# Patient Record
Sex: Female | Born: 1990 | Race: Black or African American | Hispanic: No | Marital: Single | Smoking: Current every day smoker
Health system: Southern US, Community
[De-identification: ages and names within clinical notes are randomized; demographics above are authoritative.]

---

## 2013-11-20 ENCOUNTER — Emergency Department (HOSPITAL_COMMUNITY)
Admission: EM | Admit: 2013-11-20 | Discharge: 2013-11-20 | Disposition: A | Discharge status: Home / Self Care | Payer: Medicaid Other | Attending: Emergency Medicine | Admitting: Emergency Medicine

## 2013-11-20 ENCOUNTER — Encounter (HOSPITAL_COMMUNITY): Payer: Self-pay | Admitting: Emergency Medicine

## 2013-11-20 ENCOUNTER — Emergency Department (HOSPITAL_COMMUNITY): Payer: Medicaid Other

## 2013-11-20 DIAGNOSIS — H6122 Impacted cerumen, left ear: Secondary | ICD-10-CM

## 2013-11-20 DIAGNOSIS — F1210 Cannabis abuse, uncomplicated: Secondary | ICD-10-CM | POA: Insufficient documentation

## 2013-11-20 DIAGNOSIS — J069 Acute upper respiratory infection, unspecified: Secondary | ICD-10-CM | POA: Insufficient documentation

## 2013-11-20 DIAGNOSIS — Z79899 Other long term (current) drug therapy: Secondary | ICD-10-CM | POA: Insufficient documentation

## 2013-11-20 DIAGNOSIS — H6120 Impacted cerumen, unspecified ear: Secondary | ICD-10-CM | POA: Insufficient documentation

## 2013-11-20 DIAGNOSIS — F17200 Nicotine dependence, unspecified, uncomplicated: Secondary | ICD-10-CM | POA: Insufficient documentation

## 2013-11-20 MED ORDER — GUAIFENESIN 100 MG/5ML PO LIQD: 100.0000 mg | ORAL | Status: DC | PRN

## 2013-11-20 MED ORDER — ALBUTEROL SULFATE HFA 108 (90 BASE) MCG/ACT IN AERS: 2.0000 | INHALATION_SPRAY | Freq: Four times a day (QID) | RESPIRATORY_TRACT | Status: DC | PRN

## 2013-11-20 NOTE — ED Notes (Signed)
Pt taken to xray 

## 2013-11-20 NOTE — ED Notes (Signed)
Pt states that after thanksgiving pt started experiencing cough, wheezing and not being able to hear out of her left ear. Pt states that she has tried nyquil. Pt states she has not seen a PCP.

## 2013-11-20 NOTE — ED Provider Notes (Addendum)
CSN: [id number]     Arrival date & time 11/20/13  1951 History  This chart was scribed for non-physician practitioner, Francee Piccolo, PA-C working with Junius Argyle, MD, by Andrew Au, ED Scribe. This patient was seen in room TR11C/TR11C and the patient's care was started at 8:43 PM     Chief Complaint  Patient presents with  . Wheezing    The history is provided by the patient. No language interpreter was used.   HPI Comments: Veronica Lee is a 22 y.o. female who presents to the Emergency Department complaining of unchanged, non-productive cough and wheezing that began 10 days ago. She states nothing makes the cough or wheezing better. She is also complaining of a left ear pain with associated decreased hearing. She denies any ear drainage, fever. Pt does smoke marijuana ofthen. Pt denies a history of asthma.   History reviewed. No pertinent past medical history. History reviewed. No pertinent past surgical history. No family history on file. History  Substance Use Topics  . Smoking status: Current Every Day Smoker  . Smokeless tobacco: Not on file  . Alcohol Use: Yes     Comment: 3 drinks per month.   OB History   Grav Para Term Preterm Abortions TAB SAB Ect Mult Living                 Review of Systems  Constitutional: Negative for fever.  HENT: Positive for ear pain. Negative for ear discharge.   Respiratory: Positive for cough and wheezing. Negative for chest tightness and shortness of breath.     Allergies  Review of patient's allergies indicates no known allergies.  Home Medications   Current Outpatient Rx  Name  Route  Sig  Dispense  Refill  . albuterol (PROVENTIL HFA;VENTOLIN HFA) 108 (90 BASE) MCG/ACT inhaler   Inhalation   Inhale 2 puffs into the lungs every 6 (six) hours as needed for wheezing or shortness of breath.   1 Inhaler   2   . guaiFENesin (ROBITUSSIN) 100 MG/5ML liquid   Oral   Take 5-10 mLs (100-200 mg total) by mouth every 4 (four)  hours as needed for cough.   60 mL   0    BP 137/99  Pulse 84  Temp(Src) 97.7 F (36.5 C) (Oral)  Resp 16  Ht 5\' 2"  (1.575 m)  Wt 187 lb (84.823 kg)  BMI 34.19 kg/m2  SpO2 97%  LMP 09/20/2013 Physical Exam  Nursing note and vitals reviewed. Constitutional: She is oriented to person, place, and time. She appears well-developed and well-nourished. No distress.  HENT:  Head: Normocephalic and atraumatic.  Right Ear: Hearing, tympanic membrane, external ear and ear canal normal.  Left Ear: External ear and ear canal normal.  Nose: Rhinorrhea present.  Mouth/Throat: Oropharynx is clear and moist.  Left ear cerumen impaction.   Eyes: Conjunctivae are normal.  Neck: Normal range of motion. Neck supple.  Cardiovascular: Normal rate.   Pulmonary/Chest: Effort normal and breath sounds normal. No respiratory distress.  Abdominal: Soft.  Musculoskeletal: Normal range of motion.  Neurological: She is alert and oriented to person, place, and time.  Skin: Skin is warm and dry. She is not diaphoretic.  Psychiatric: She has a normal mood and affect.    ED Course  EAR CERUMEN REMOVAL Date/Time: 11/30/2013 6:30 PM Performed by: Jeannetta Ellis Authorized by: Jeannetta Ellis Consent: Verbal consent obtained. Local anesthetic: none Location details: left ear Procedure type: curette and irrigation Patient sedated: no  DIAGNOSTIC STUDIES: Oxygen Saturation is 98% on RA, normal by my interpretation.    COORDINATION OF CARE:  8:43 PM-Discussed treatment plan which includes CXR and ear wax removal with pt at bedside and pt agreed to plan.   Labs Review Labs Reviewed - No data to display Imaging Review Dg Chest 2 View  11/20/2013   CLINICAL DATA:  Cough for 2 weeks  EXAM: CHEST  2 VIEW  COMPARISON:  None.  FINDINGS: The heart size and mediastinal contours are within normal limits. Both lungs are clear. The visualized skeletal structures are unremarkable.  IMPRESSION:  No active cardiopulmonary disease.   Electronically Signed   By: Esperanza Heir M.D.   On: 11/20/2013 21:14    EKG Interpretation   None       MDM   1. URI (upper respiratory infection)   2. Cerumen impaction, left    Afebrile, NAD, non-toxic appearing, AAOx4.   1) URI: Pt CXR negative for acute infiltrate. Patients symptoms are consistent with URI, likely viral etiology. Discussed that antibiotics are not indicated for viral infections. Pt will be discharged with symptomatic treatment.  Verbalizes understanding and is agreeable with plan. Pt is hemodynamically stable & in NAD prior to dc.  2) Cerumen Impaction: successful removed. TM visualized w/o abnormality.  Return precautions discussed. Patient is agreeable to plan. Patient is stable at time of discharge    I personally performed the services described in this documentation, which was scribed in my presence. The recorded information has been reviewed and is accurate.     Jeannetta Ellis, PA-C 11/20/13 2320  Jeannetta Ellis, PA-C 11/30/13 1830

## 2013-11-20 NOTE — ED Notes (Signed)
Pt back from x-ray.

## 2013-11-21 NOTE — ED Provider Notes (Signed)
Medical screening examination/treatment/procedure(s) were performed by non-physician practitioner and as supervising physician I was immediately available for consultation/collaboration.  EKG Interpretation   None         Junius Argyle, MD 11/21/13 1332

## 2013-12-05 NOTE — ED Provider Notes (Signed)
Medical screening examination/treatment/procedure(s) were performed by non-physician practitioner and as supervising physician I was immediately available for consultation/collaboration.  EKG Interpretation   None         Junius Argyle, MD 12/05/13 1132

## 2014-04-30 ENCOUNTER — Encounter (HOSPITAL_COMMUNITY): Payer: Self-pay | Admitting: Emergency Medicine

## 2014-04-30 ENCOUNTER — Emergency Department (HOSPITAL_COMMUNITY)
Admission: EM | Admit: 2014-04-30 | Discharge: 2014-04-30 | Disposition: A | Discharge status: Home / Self Care | Payer: Medicaid Other | Attending: Emergency Medicine | Admitting: Emergency Medicine

## 2014-04-30 DIAGNOSIS — Z3202 Encounter for pregnancy test, result negative: Secondary | ICD-10-CM | POA: Insufficient documentation

## 2014-04-30 DIAGNOSIS — F17200 Nicotine dependence, unspecified, uncomplicated: Secondary | ICD-10-CM | POA: Insufficient documentation

## 2014-04-30 DIAGNOSIS — R112 Nausea with vomiting, unspecified: Secondary | ICD-10-CM | POA: Insufficient documentation

## 2014-04-30 DIAGNOSIS — R197 Diarrhea, unspecified: Secondary | ICD-10-CM | POA: Insufficient documentation

## 2014-04-30 LAB — COMPREHENSIVE METABOLIC PANEL
ALT: 35 U/L (ref 0–35)
AST: 18 U/L (ref 0–37)
Albumin: 3.4 g/dL — ABNORMAL LOW (ref 3.5–5.2)
Alkaline Phosphatase: 70 U/L (ref 39–117)
BUN: 9 mg/dL (ref 6–23)
CALCIUM: 9 mg/dL (ref 8.4–10.5)
CO2: 20 mEq/L (ref 19–32)
Chloride: 106 mEq/L (ref 96–112)
Creatinine, Ser: 1.08 mg/dL (ref 0.50–1.10)
GFR calc non Af Amer: 72 mL/min — ABNORMAL LOW (ref >90)
GFR, EST AFRICAN AMERICAN: 84 mL/min — AB (ref >90)
Glucose, Bld: 107 mg/dL — ABNORMAL HIGH (ref 70–99)
Potassium: 3.7 mEq/L (ref 3.7–5.3)
Sodium: 140 mEq/L (ref 137–147)
TOTAL PROTEIN: 7.1 g/dL (ref 6.0–8.3)
Total Bilirubin: 0.4 mg/dL (ref 0.3–1.2)

## 2014-04-30 LAB — URINALYSIS, ROUTINE W REFLEX MICROSCOPIC
Bilirubin Urine: NEGATIVE
Glucose, UA: NEGATIVE mg/dL
Ketones, ur: NEGATIVE mg/dL
Leukocytes, UA: NEGATIVE
NITRITE: NEGATIVE
PH: 5 (ref 5.0–8.0)
Protein, ur: NEGATIVE mg/dL
Specific Gravity, Urine: 1.017 (ref 1.005–1.030)
Urobilinogen, UA: 0.2 mg/dL (ref 0.0–1.0)

## 2014-04-30 LAB — CBC WITH DIFFERENTIAL/PLATELET
BASOS ABS: 0 10*3/uL (ref 0.0–0.1)
Basophils Relative: 0 % (ref 0–1)
EOS ABS: 0.4 10*3/uL (ref 0.0–0.7)
EOS PCT: 4 % (ref 0–5)
HCT: 35.5 % — ABNORMAL LOW (ref 36.0–46.0)
Hemoglobin: 12.3 g/dL (ref 12.0–15.0)
Lymphocytes Relative: 15 % (ref 12–46)
Lymphs Abs: 1.4 10*3/uL (ref 0.7–4.0)
MCH: 29.4 pg (ref 26.0–34.0)
MCHC: 34.6 g/dL (ref 30.0–36.0)
MCV: 84.9 fL (ref 78.0–100.0)
Monocytes Absolute: 0.5 10*3/uL (ref 0.1–1.0)
Monocytes Relative: 6 % (ref 3–12)
Neutro Abs: 7 10*3/uL (ref 1.7–7.7)
Neutrophils Relative %: 75 % (ref 43–77)
PLATELETS: 230 10*3/uL (ref 150–400)
RBC: 4.18 MIL/uL (ref 3.87–5.11)
RDW: 13.8 % (ref 11.5–15.5)
WBC: 9.3 10*3/uL (ref 4.0–10.5)

## 2014-04-30 LAB — URINE MICROSCOPIC-ADD ON

## 2014-04-30 LAB — POC URINE PREG, ED: PREG TEST UR: NEGATIVE

## 2014-04-30 MED ORDER — LOPERAMIDE HCL 2 MG PO TABS: 2.0000 mg | ORAL_TABLET | Freq: Four times a day (QID) | ORAL | Status: AC | PRN

## 2014-04-30 MED ORDER — ONDANSETRON HCL 4 MG/2ML IJ SOLN
4.0000 mg | Freq: Once | INTRAMUSCULAR | Status: AC
  Administered 2014-04-30: 4 mg via INTRAVENOUS
  Filled 2014-04-30: qty 2

## 2014-04-30 MED ORDER — LOPERAMIDE HCL 2 MG PO CAPS
2.0000 mg | ORAL_CAPSULE | Freq: Once | ORAL | Status: AC
  Administered 2014-04-30: 2 mg via ORAL
  Filled 2014-04-30: qty 1

## 2014-04-30 MED ORDER — ONDANSETRON HCL 4 MG PO TABS: 4.0000 mg | ORAL_TABLET | Freq: Four times a day (QID) | ORAL | Status: AC

## 2014-04-30 MED ORDER — SODIUM CHLORIDE 0.9 % IV BOLUS (SEPSIS)
1000.0000 mL | Freq: Once | INTRAVENOUS | Status: AC
  Administered 2014-04-30: 1000 mL via INTRAVENOUS

## 2014-04-30 NOTE — Discharge Instructions (Signed)
Take the prescribed medication as directed. Follow-up with [facility] if diarrhea continues. Return to the ED for new or worsening symptoms.

## 2014-04-30 NOTE — ED Notes (Signed)
Pt given ginger ale.

## 2014-04-30 NOTE — ED Provider Notes (Signed)
CSN: [id number]     Arrival date & time 04/30/14  0741 History   First MD Initiated Contact with Patient 04/30/14 0745     Chief Complaint  Patient presents with  . Diarrhea     (Consider location/radiation/quality/duration/timing/severity/associated sxs/prior Treatment) The history is provided by the patient and medical records.   This is a 23 y.o. F with no significant PMH presenting to the ED for diarrhea.  Patient states for the past week she has been having persistent watery diarrhea at every BM, seems independent of type of food or drink.  She also endorses some intermittent nausea and nonbloody, nonbilious emesis, last episode earlier today while trying to drive to work.  She states she was seen in the emergency room in [place] and had blood work and stool cultures performed-- was told were all negative. She was told her symptoms were due to acid reflux, was started on Prilosec which she has been taking without improvement. She denies any recent travel out of the country. No recent antibiotics use. She denies fever or chills. No prior history of IBS or Crohn's.  VS stable on arrival.  History reviewed. No pertinent past medical history. No past surgical history on file. No family history on file. History  Substance Use Topics  . Smoking status: Current Every Day Smoker  . Smokeless tobacco: Not on file  . Alcohol Use: Yes     Comment: 3 drinks per month.   OB History   Grav Para Term Preterm Abortions TAB SAB Ect Mult Living                 Review of Systems  Gastrointestinal: Positive for nausea, vomiting and diarrhea.  All other systems reviewed and are negative.     Allergies  Review of patient's allergies indicates no known allergies.  Home Medications   Prior to Admission medications   Medication Sig Start Date End Date Taking? Authorizing Provider  albuterol (PROVENTIL HFA;VENTOLIN HFA) 108 (90 BASE) MCG/ACT inhaler Inhale 2 puffs into the lungs every 6 (six)  hours as needed for wheezing or shortness of breath. 11/20/13   Jennifer L Piepenbrink, PA-C  guaiFENesin (ROBITUSSIN) 100 MG/5ML liquid Take 5-10 mLs (100-200 mg total) by mouth every 4 (four) hours as needed for cough. 11/20/13   Jennifer L Piepenbrink, PA-C   BP 129/79  Temp(Src) 98.2 F (36.8 C) (Oral)  Resp 16  SpO2 98%  Physical Exam  Nursing note and vitals reviewed. Constitutional: She is oriented to person, place, and time. She appears well-developed and well-nourished. No distress.  HENT:  Head: Normocephalic and atraumatic.  Mouth/Throat: Oropharynx is clear and moist.  Mildly dry mucous membranes  Eyes: Conjunctivae and EOM are normal. Pupils are equal, round, and reactive to light.  Neck: Normal range of motion. Neck supple.  Cardiovascular: Normal rate, regular rhythm and normal heart sounds.   Pulmonary/Chest: Effort normal and breath sounds normal. No respiratory distress. She has no wheezes.  Abdominal: Soft. Bowel sounds are normal. There is no tenderness. There is no rigidity and no guarding.  Abdomen soft, nondistended, no peritoneal signs  Musculoskeletal: Normal range of motion. She exhibits no edema.  Neurological: She is alert and oriented to person, place, and time.  Skin: Skin is warm. She is not diaphoretic.  Psychiatric: She has a normal mood and affect.    ED Course  Procedures (including critical care time) Labs Review Labs Reviewed  CBC WITH DIFFERENTIAL - Abnormal; Notable for the following:  HCT 35.5 (*)    All other components within normal limits  COMPREHENSIVE METABOLIC PANEL - Abnormal; Notable for the following:    Glucose, Bld 107 (*)    Albumin 3.4 (*)    GFR calc non Af Amer 72 (*)    GFR calc Af Amer 84 (*)    All other components within normal limits  URINALYSIS, ROUTINE W REFLEX MICROSCOPIC - Abnormal; Notable for the following:    APPearance TURBID (*)    Hgb urine dipstick TRACE (*)    All other components within normal limits   URINE MICROSCOPIC-ADD ON - Abnormal; Notable for the following:    Bacteria, UA FEW (*)    Crystals URIC ACID CRYSTALS (*)    All other components within normal limits  POC URINE PREG, ED    Imaging Review No results found.   EKG Interpretation None      MDM   Final diagnoses:  Diarrhea   23 y.o. F with recurrent watery diarrhea over the past 2 weeks without recent travel or abx use.  Prior evaluation at home in [place] without identifiable cause of her sx.  States has continued and now she feels weak.  On exam, she is afebrile and overall non-toxic appearing.  Her abdominal exam is benign, mucous membranes mildly dry.  Will obtain basic labs, u/a.  IV fluids, zofran, and dose of imodium given.  Will reassess.  Labs largely WNL.  After fluids and meds symptoms have improved.  Pt has tolerated PO solids and liquids.  No episodes of diarrhea or vomiting while in the ED.  Abdominal exam remains benign.  Low suspicion for c. Diff or other infectious diarrhea given no RF and negative work-up today.  Patient remains afebrile, non-toxic appearing, NAD, VS stable- ok for discharge.  Pt will be discharged with symptomatic care.  She was given GI FU if symptoms continue.  Discussed plan with patient, he/she acknowledged understanding and agreed with plan of care.  Return precautions given for new or worsening symptoms.  Garlon HatchetLisa M Zenith Lamphier, PA-C 04/30/14 1319

## 2014-04-30 NOTE — ED Notes (Signed)
Pt brought back to room; instructed to get undressed and into a gown; RN aware

## 2014-04-30 NOTE — ED Notes (Signed)
Pt states that they did draw labs yesterday because they has to stick her x 2 but she does not results with her states they gave her prilosec for her abd pain and diarrhea

## 2014-04-30 NOTE — ED Notes (Signed)
Diarrhea x 2 weeks went to [place] in [place] yesterday and given meds but they are not helping  Vomited today she states states she feels weak today

## 2014-05-01 NOTE — ED Provider Notes (Signed)
Medical screening examination/treatment/procedure(s) were conducted as a shared visit with non-physician practitioner(s) and myself.  I personally evaluated the patient during the encounter.   EKG Interpretation None      Pt c/o diarrhea. Several episodes loose to watery, non bloody stools. Had intermittent diffuse crampy abd pain earlier, no constant/focal pain. abd soft nt. Iv ns bolus. Labs.   Suzi RootsKevin E Harrington Jobe, MD 05/01/14 [phone]

## 2015-10-06 IMAGING — CR DG CHEST 2V
2 series · 2 of 2 positions shown · non-contrast
Comparison: None.

CLINICAL DATA: Cough for 2 weeks

EXAM:
CHEST  2 VIEW

[w chest pa]
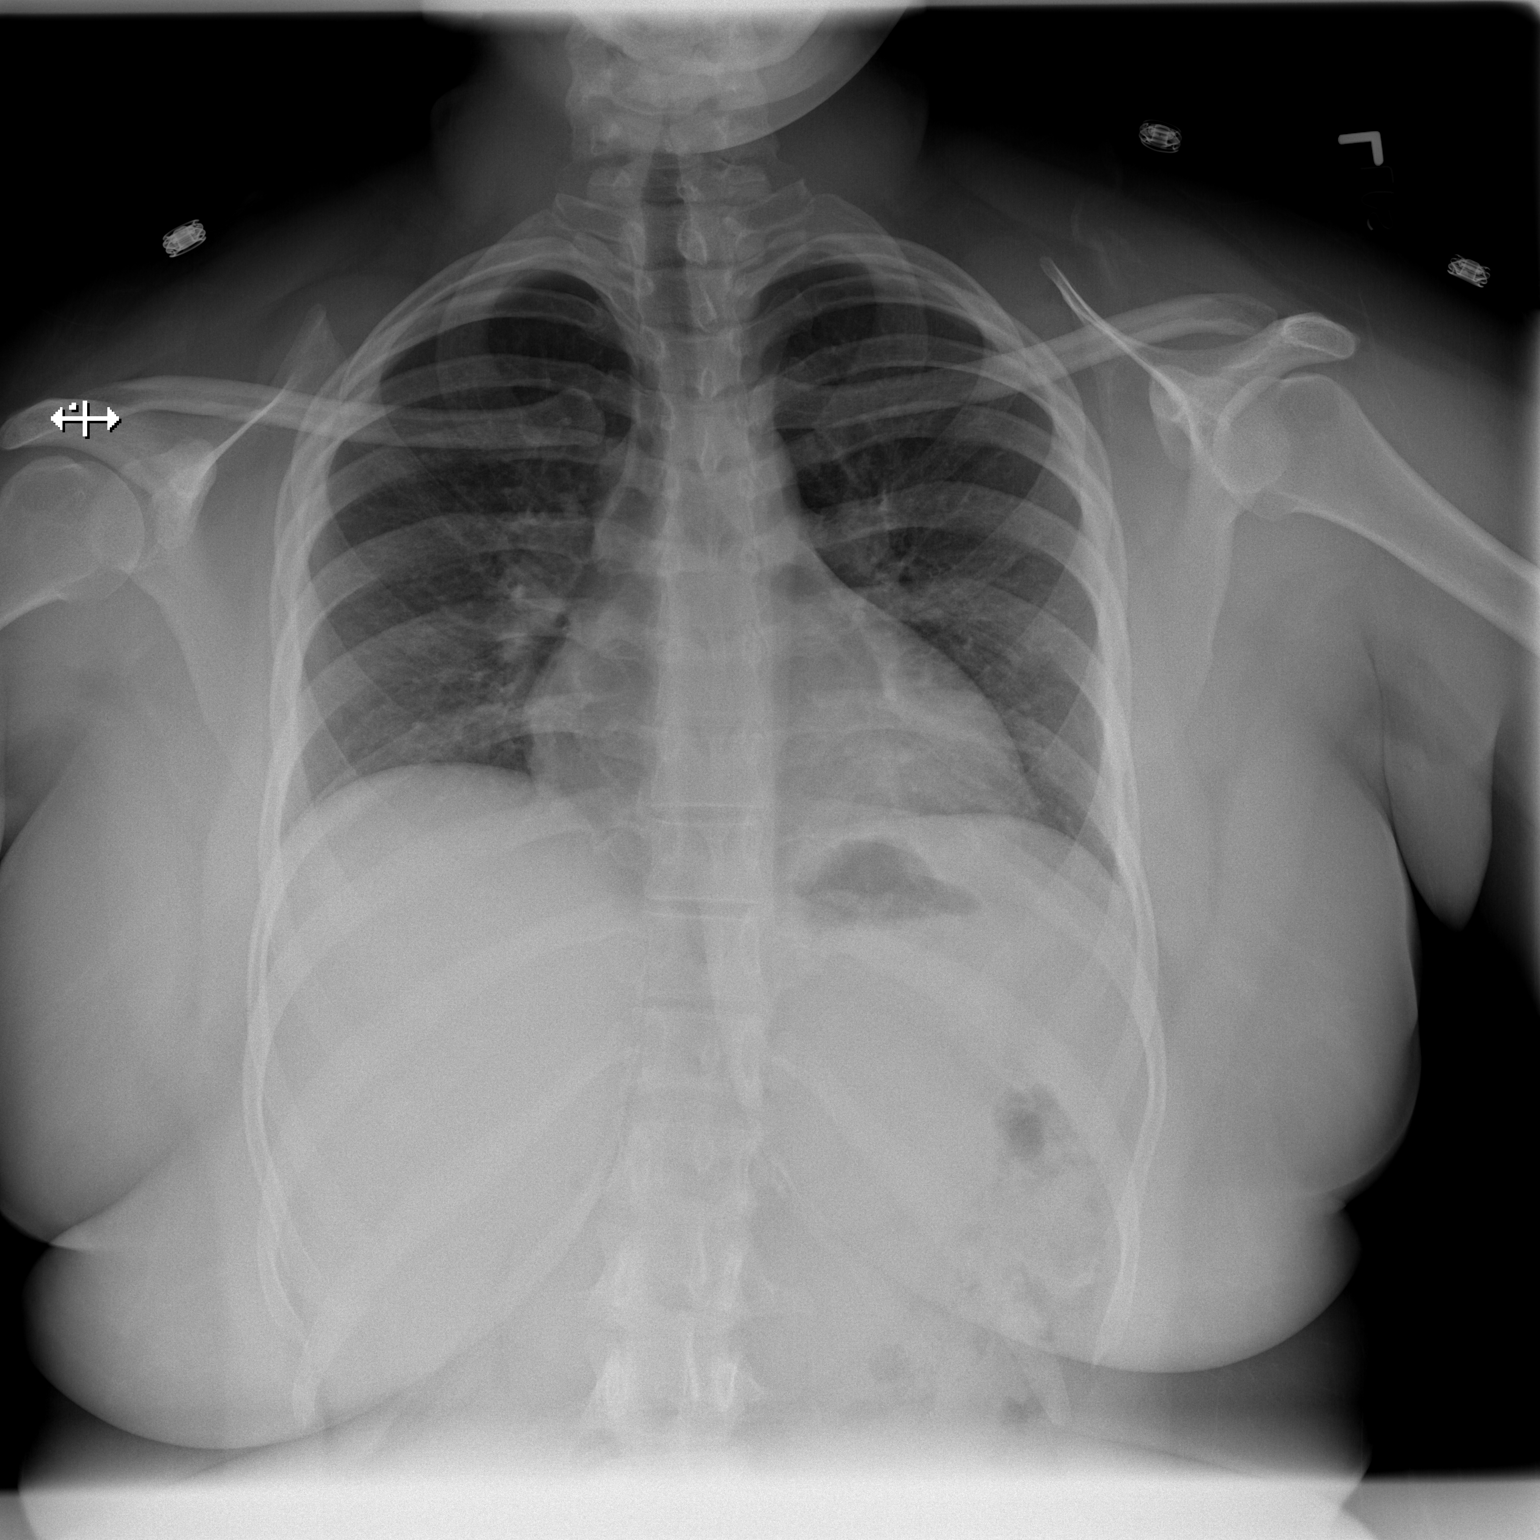

[w chest lat]
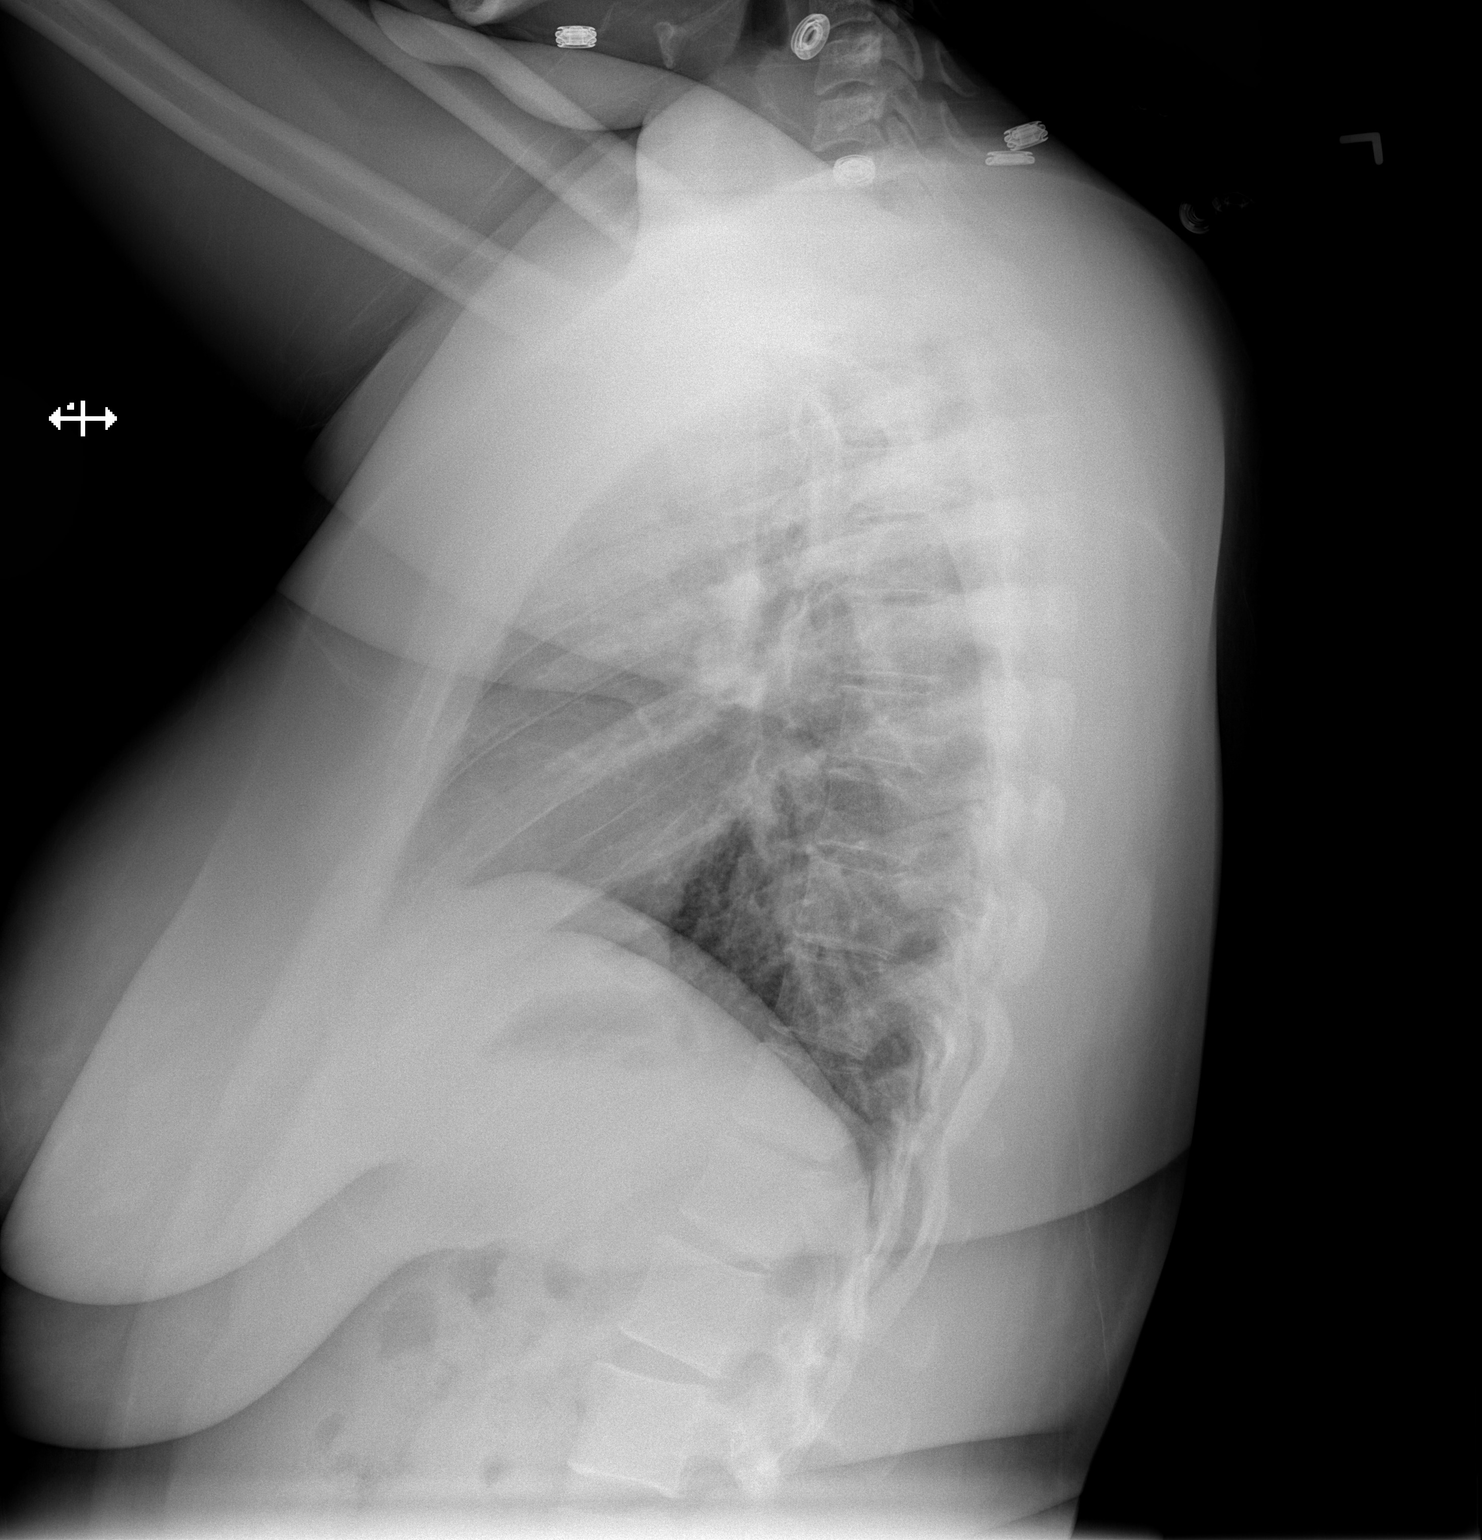

[2 of 2 positions shown; findings below may reference images not displayed]

FINDINGS: The heart size and mediastinal contours are within normal limits.
Both lungs are clear. The visualized skeletal structures are
unremarkable.
IMPRESSION: No active cardiopulmonary disease.
